# Patient Record
Sex: Male | Born: 1990 | Race: White | Hispanic: No | Marital: Single | Smoking: Current every day smoker
Health system: Southern US, Community
[De-identification: ages and names within clinical notes are randomized; demographics above are authoritative.]

## PROBLEM LIST (undated history)

## (undated) DIAGNOSIS — F988 Other specified behavioral and emotional disorders with onset usually occurring in childhood and adolescence: Secondary | ICD-10-CM

## (undated) HISTORY — PX: SHOULDER ARTHROSCOPY: SHX128

---
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
## 2013-08-28 ENCOUNTER — Ambulatory Visit (HOSPITAL_COMMUNITY)
Admission: RE | Admit: 2013-08-28 | Discharge: 2013-08-28 | Disposition: A | Discharge status: Home / Self Care | Payer: 59 | Source: Ambulatory Visit | Attending: Specialist | Admitting: Specialist

## 2013-08-28 ENCOUNTER — Other Ambulatory Visit (HOSPITAL_COMMUNITY): Payer: Self-pay | Admitting: Specialist

## 2013-08-28 DIAGNOSIS — Z1389 Encounter for screening for other disorder: Secondary | ICD-10-CM | POA: Insufficient documentation

## 2013-08-28 DIAGNOSIS — IMO0002 Reserved for concepts with insufficient information to code with codable children: Secondary | ICD-10-CM

## 2015-10-06 ENCOUNTER — Ambulatory Visit (INDEPENDENT_AMBULATORY_CARE_PROVIDER_SITE_OTHER): Payer: 59 | Admitting: Medical

## 2015-10-06 ENCOUNTER — Encounter: Payer: Self-pay | Admitting: Medical

## 2015-10-06 VITALS — BP 100/70 | HR 86 | Ht 72.25 in | Wt 173.0 lb

## 2015-10-06 DIAGNOSIS — F909 Attention-deficit hyperactivity disorder, unspecified type: Secondary | ICD-10-CM

## 2015-10-06 DIAGNOSIS — G4700 Insomnia, unspecified: Secondary | ICD-10-CM | POA: Diagnosis not present

## 2015-10-06 DIAGNOSIS — Z599 Problem related to housing and economic circumstances, unspecified: Secondary | ICD-10-CM

## 2015-10-06 DIAGNOSIS — Z598 Other problems related to housing and economic circumstances: Secondary | ICD-10-CM

## 2015-10-06 DIAGNOSIS — M722 Plantar fascial fibromatosis: Secondary | ICD-10-CM

## 2015-10-06 DIAGNOSIS — F988 Other specified behavioral and emotional disorders with onset usually occurring in childhood and adolescence: Secondary | ICD-10-CM

## 2015-10-06 MED ORDER — METHYLPHENIDATE HCL 10 MG PO TABS: 10.0000 mg | ORAL_TABLET | Freq: Two times a day (BID) | ORAL | Status: DC

## 2015-10-06 MED ORDER — CLONIDINE HCL 0.1 MG PO TABS: 0.1000 mg | ORAL_TABLET | Freq: Every day | ORAL | Status: DC

## 2015-10-06 NOTE — Progress Notes (Signed)
Subjective: Chief Complaint  Patient presents with  . New Patient (Initial Visit)    used to go to [facility], said last physical was 08/15. declines flu shot.  . 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   . trouble falling asleep    said his brain wont stop running.   . foot pain    said that its mainly in his lt foot. feels like a tendon he said. states he tried different boots and insoles nothing has helped.    Here as a new patient.  I see his mother Saralyn PilarLouann Keaton.  Has several concerns.   Having trouble staying focused at work.   Has hx/o ADD, was on medication when he was younger.  Got tired of the medication and stopped taking this.   Was diagnosed ADD in first grade, diagnosed by Dr. Rosezetta SchlatterBurnette at [facility].   Was on medication from first grade up to high school.  Decided to quit taking it in high school.   He only recalls being on Ritalin and a patch at one point.  Was tried on others, but would get nausea or not feel himself on the medication.   hasn't been on medication since high school.  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  Feels like he needs to resume ADD medication.   Has trouble getting to sleep, mind wont' shut down.   Has trouble getting to sleep initially. Will go to bed at 9 or 10pm and won't fall asleep til 3am.  Currently stressors include bills, work, in the winter work is limited.   He and fiance split up a month ago.  Job security is a concern. Thinks about safety for him and others as people often pull out in front of him driving big trucks.   He is single, lives with mother.   Has no significant other.   Works at [employer].   Drives steel body dump trailer, relatively short distances.   Attends church, praying about his concerns.  Not  exercising as much as prior due to shoulder issue.  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   In the winter, work is not always steady.   Has truck and 4 wheeler payment.  He and fiance just broke things off recently.   Has bilat feet pain in soles for 3 years, poor arches, has tried orthotics and different boots, but nothing helps .softer sole shoes make it worse.    ROS as in subjective   Objective: BP 100/70 mmHg  Pulse 86  Ht 6' 0.25" (1.835 m)  Wt 173 lb (78.472 kg)  BMI 23.30 kg/m2  Gen: wd, wn, nad Heart: RRR, normal s1, s2, no murmurs lungs clear Pulses normal Ext: no edema Psych: pleasant, good eye contact, answers questions appropriately Feet mild tenderness of left volar foot, poor arches bilat, otherwise feet nontender with out deformity, normal ROM Neuro: nonfocal exam throughout    Assessment: Encounter Diagnoses  Name Primary?  . ADD (attention deficit disorder) Yes  . Insomnia   . Financial difficulty   . Plantar fasciitis      Plan: ADD - reviewed history, current symptoms, concerns.   Adult ADHD-RS-IV with Adult Prompts questionnaire with Inattentive score of 15, and hyperactive-impulse score of 15.  Begin trial of  Methylphenidate  one to 2 times daily.  Recheck 52mo.  Insomnia - begin trial of QHS Clonidine after 1-2 week if still having issues.   Discussed other ways to work on sleep hygiene.  If starting Clonidine, then don't stop abruptly without consulting me  Financial difficulty - discussed his concern, budgeting, having long range and short range goals and plans  Plantar fascitis - begin daily towel stretch and tennis ball massage.  He has failed orthotic.  If not improving in a few weeks, then will try 90 degree QHS splints. Of note, he wears boots/cowboy boots all the time.

## 2015-11-30 ENCOUNTER — Telehealth: Payer: Self-pay | Admitting: Medical

## 2015-11-30 NOTE — Telephone Encounter (Signed)
Please call  Ritalin is not working and he can not take the sleep meds, when he took it he slept 22 hours straight Mom states he is not sleeping due to his anxiety . Mom states he has hard time trying to explain his symptons to you  She called for him but call back to patient

## 2015-12-01 NOTE — Telephone Encounter (Signed)
Made appt for Friday morning

## 2015-12-01 NOTE — Telephone Encounter (Signed)
He was suppose to f/u in 32mo.  So schedule f/u to discuss next steps.

## 2015-12-03 ENCOUNTER — Ambulatory Visit (INDEPENDENT_AMBULATORY_CARE_PROVIDER_SITE_OTHER): Payer: 59 | Admitting: Medical

## 2015-12-03 ENCOUNTER — Encounter: Payer: Self-pay | Admitting: Medical

## 2015-12-03 VITALS — BP 122/78 | HR 68 | Wt 170.0 lb

## 2015-12-03 DIAGNOSIS — G4700 Insomnia, unspecified: Secondary | ICD-10-CM | POA: Insufficient documentation

## 2015-12-03 DIAGNOSIS — F909 Attention-deficit hyperactivity disorder, unspecified type: Secondary | ICD-10-CM | POA: Diagnosis not present

## 2015-12-03 DIAGNOSIS — F988 Other specified behavioral and emotional disorders with onset usually occurring in childhood and adolescence: Secondary | ICD-10-CM | POA: Insufficient documentation

## 2015-12-03 MED ORDER — TRAZODONE HCL 50 MG PO TABS: ORAL_TABLET | ORAL | Status: AC

## 2015-12-03 MED ORDER — AMPHETAMINE-DEXTROAMPHET ER 15 MG PO CP24: 15.0000 mg | ORAL_CAPSULE | ORAL | Status: AC

## 2015-12-03 NOTE — Progress Notes (Signed)
Subjective: Chief Complaint  Patient presents with  . follow-up    follow-up on meds. Ritalin- doesn't seem to be helping and sleep med is too strong. only took it once and slept for 20 hours   Here for f/u from first visit regarding ADD and sleep issues.  Since last visit Ritalin didn't seem to work and felt like the one time he took Clonidine he sleep 22 hours.   Since last visit taking Ritalin but seeing absolutely no improvements, doesn't even feel like anything is in his system.   He only took the Clonidine once.   He has been eating healthier and exercising more since last visit.  From last visit, he has trouble staying focused at work.   Has hx/o ADD, was on medication when he was younger.  Got tired of the medication and stopped taking this.   Was diagnosed ADD in first grade, diagnosed by Dr. Rosezetta Schlatter at [facility].   Was on medication from first grade up to high school.  Decided to quit taking it in high school.   He only recalls being on Ritalin and a patch at one point.  Was tried on others, but would get nausea or not feel himself on the medication.   hasn't been on medication since high school.  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  Feels like he needs to resume ADD medication.   Has trouble getting to sleep, mind wont' shut down.   Has trouble getting to sleep initially. Will go to bed at 9 or 10pm and won't fall asleep til 3am.  Currently stressors include bills, work, in the winter work is limited.   He and fiance split up a month ago.  Job security is a concern. Thinks about safety for him and others as people often pull out in front of him driving big trucks.   He is single, lives with mother.   Has no significant other.   Works at [employer].   Drives steel body dump trailer, relatively short distances.   Attends church, praying about his concerns.    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   In the winter, work is not always steady.   Has truck and 4 wheeler payment.  He and fiance just broke things off recently.   ROS as in subjective   Objective: BP 122/78 mmHg  Pulse 68  Wt 170 lb (77.111 kg)  Gen: wd, wn, nad Psych: pleasant, good eye contact, answers questions appropriately   Assessment: Encounter Diagnoses  Name Primary?  . ADD (attention deficit disorder) Yes  . Insomnia      Plan: Discussed recommendations and changes below.  Discussed risks/benefits of medication, proper use of medications.   Call report 1wk  Patient Instructions  Recommendations:  Begin Adderall XR once daily in the morning  This is taken once daily, not twice daily  Also begin Benadryl OTC  at bedtime starting today  Once you have been on the Adderall at least a week, then call back to give [place] update on how this is doing  Once you feel like this is working ok, then we will potentially add Trazodone for sleep  Trazodone is a sleep aid that can be taken 1/2 or 1 tablet at night daily.   If you end up starting this, begin 1/2 tablet at bedtime daily  Don't start Trazodone though until  you have been on the Adderall at least a week  Use benadryl initially for sleep.    Keep bedtime consistent

## 2015-12-03 NOTE — Patient Instructions (Signed)
Recommendations:  Begin Adderall XR once daily in the morning  This is taken once daily, not twice daily  Also begin Benadryl OTC  at bedtime starting today  Once you have been on the Adderall at least a week, then call back to give [place] update on how this is doing  Once you feel like this is working ok, then we will potentially add Trazodone for sleep  Trazodone is a sleep aid that can be taken 1/2 or 1 tablet at night daily.   If you end up starting this, begin 1/2 tablet at bedtime daily  Don't start Trazodone though until you have been on the Adderall at least a week  Use benadryl initially for sleep.    Keep bedtime consistent

## 2017-04-19 ENCOUNTER — Emergency Department (HOSPITAL_COMMUNITY)
Admission: EM | Admit: 2017-04-19 | Discharge: 2017-04-19 | Disposition: A | Discharge status: Home / Self Care | Payer: Self-pay | Attending: Emergency Medicine | Admitting: Emergency Medicine

## 2017-04-19 ENCOUNTER — Other Ambulatory Visit: Payer: Self-pay

## 2017-04-19 ENCOUNTER — Emergency Department (HOSPITAL_COMMUNITY): Payer: Self-pay

## 2017-04-19 ENCOUNTER — Encounter (HOSPITAL_COMMUNITY): Payer: Self-pay | Admitting: Emergency Medicine

## 2017-04-19 DIAGNOSIS — E860 Dehydration: Secondary | ICD-10-CM | POA: Insufficient documentation

## 2017-04-19 DIAGNOSIS — R55 Syncope and collapse: Secondary | ICD-10-CM

## 2017-04-19 DIAGNOSIS — R112 Nausea with vomiting, unspecified: Secondary | ICD-10-CM | POA: Insufficient documentation

## 2017-04-19 DIAGNOSIS — F17200 Nicotine dependence, unspecified, uncomplicated: Secondary | ICD-10-CM | POA: Insufficient documentation

## 2017-04-19 HISTORY — DX: Other specified behavioral and emotional disorders with onset usually occurring in childhood and adolescence: F98.8

## 2017-04-19 LAB — BASIC METABOLIC PANEL
Anion gap: 7 (ref 5–15)
BUN: 15 mg/dL (ref 6–20)
CALCIUM: 8.9 mg/dL (ref 8.9–10.3)
CO2: 25 mmol/L (ref 22–32)
CREATININE: 1.05 mg/dL (ref 0.61–1.24)
Chloride: 106 mmol/L (ref 101–111)
GFR calc Af Amer: >60 mL/min (ref >60)
GFR calc non Af Amer: >60 mL/min (ref >60)
GLUCOSE: 96 mg/dL (ref 65–99)
Potassium: 4 mmol/L (ref 3.5–5.1)
Sodium: 138 mmol/L (ref 135–145)

## 2017-04-19 LAB — CBC
HCT: 41.9 % (ref 39.0–52.0)
HEMOGLOBIN: 14.2 g/dL (ref 13.0–17.0)
MCH: 30.7 pg (ref 26.0–34.0)
MCHC: 33.9 g/dL (ref 30.0–36.0)
MCV: 90.7 fL (ref 78.0–100.0)
PLATELETS: 196 10*3/uL (ref 150–400)
RBC: 4.62 MIL/uL (ref 4.22–5.81)
RDW: 12.7 % (ref 11.5–15.5)
WBC: 6.1 10*3/uL (ref 4.0–10.5)

## 2017-04-19 LAB — URINALYSIS, ROUTINE W REFLEX MICROSCOPIC
BILIRUBIN URINE: NEGATIVE
Glucose, UA: NEGATIVE mg/dL
HGB URINE DIPSTICK: NEGATIVE
KETONES UR: NEGATIVE mg/dL
Leukocytes, UA: NEGATIVE
Nitrite: NEGATIVE
Protein, ur: NEGATIVE mg/dL
SPECIFIC GRAVITY, URINE: 1.025 (ref 1.005–1.030)
pH: 6 (ref 5.0–8.0)

## 2017-04-19 LAB — CK: Total CK: 93 U/L (ref 49–397)

## 2017-04-19 MED ORDER — ACETAMINOPHEN 500 MG PO TABS
1000.0000 mg | ORAL_TABLET | Freq: Once | ORAL | Status: AC
  Administered 2017-04-19: 1000 mg via ORAL
  Filled 2017-04-19: qty 2

## 2017-04-19 MED ORDER — SODIUM CHLORIDE 0.9 % IV BOLUS (SEPSIS)
1000.0000 mL | Freq: Once | INTRAVENOUS | Status: AC
  Administered 2017-04-19: 1000 mL via INTRAVENOUS

## 2017-04-19 NOTE — ED Notes (Signed)
Hooked patient up to the monitor patient is resting with family at bedside 

## 2017-04-19 NOTE — ED Notes (Signed)
Pt in xray

## 2017-04-19 NOTE — ED Notes (Signed)
Water given to pt 

## 2017-04-19 NOTE — ED Notes (Signed)
Pt reports that he was bitten by a tick a month ago in left groin.  Pt was not seen by any healthcare provider after this.  Pt reports that there was some redness around this and that he got "kinda achy".  Pt states that he was dizzy this am and he continues to feel generally weak and continues to have some nausea and when he stands up he gets lightheaded and dizzy

## 2017-04-19 NOTE — ED Provider Notes (Signed)
[facility]-EMERGENCY DEPT Provider Note   CSN: [id number] Arrival date & time: 04/19/17  4098     History   Chief Complaint Chief Complaint  Patient presents with  . Loss of Consciousness  . Emesis    HPI Antonio Delgado is a 26 y.o. male who presents for evaluation of syncope.  He reports that around midnight he was awakened from sleep by the need to vomit.  He reports that he was vomiting non bloody non bilious vomit for about one hour.  No diarrhea or constipation. He then reports that he felt very light headed and passed out.  He is unsure what happened between 1am and 8am when he woke up next to the toilet.  He is unsure if he struck his head.  He reports that his head currently feels "funny" but is unable to describe it.  He denies neck pain, SOB or chest pain.   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  He denies coming into contact with any hazardous materials saying that he was only driving and that everything was already secured and placarded.  Yesterday he felt hot, acknowledges that he doesn't hydrate well while "on a job" but hydrates well between jobs.  He denies suspicious food, last night he ate a few hot dogs from [employer] but does that on a regular basis.  Denies drug or alcohol use.  He was bitten by a tick about one month ago in his upper inner left thigh/inguinal region.  No rashes, has been well since.   HPI  Past Medical History:  Diagnosis Date  . ADD (attention deficit disorder)     Patient Active Problem List   Diagnosis Date Noted  . ADD (attention deficit disorder) 12/03/2015  . Insomnia 12/03/2015    History reviewed. No pertinent surgical history.     Home Medications    Prior to Admission medications   Medication Sig Start Date End Date Taking? Authorizing Provider  amphetamine-dextroamphetamine (ADDERALL XR) 15 MG 24 hr capsule Take 1 capsule by mouth every morning. Patient not taking: Reported on 04/19/2017 12/03/15   Jac Canavan,  PA-C  traZODone (DESYREL) 50 MG tablet 1/2-1 tablet po QHS sleep Patient not taking: Reported on 04/19/2017 12/03/15   Tysinger, Kermit Balo, PA-C    Family History History reviewed. No pertinent family history.  Social History Social History  Substance Use Topics  . Smoking status: Current Every Day Smoker    Packs/day: 0.50  . Smokeless tobacco: Never Used  . Alcohol use 0.0 oz/week     Allergies   Daytrana [methylphenidate]   Review of Systems Review of Systems  Constitutional: Positive for appetite change and fever (Subjective only, feels hot).  HENT: Negative for congestion, ear pain, rhinorrhea and sore throat.   Eyes: Negative for pain and visual disturbance.  Respiratory: Negative for cough, chest tightness and shortness of breath.   Cardiovascular: Negative for chest pain and palpitations.  Gastrointestinal: Positive for abdominal pain (After vomiting), nausea and vomiting. Negative for abdominal distention, constipation and diarrhea.  Genitourinary: Negative for difficulty urinating, dysuria, flank pain, frequency, genital sores and urgency.  Musculoskeletal: Positive for myalgias (Normal for patient, slightly increased). Negative for arthralgias, back pain, neck pain and neck stiffness.  Skin: Positive for wound (Left upper thigh, from tick bite). Negative for color change.  Neurological: Positive for weakness, light-headedness and headaches ("feels funny"). Negative for dizziness and numbness.     Physical Exam Updated Vital Signs BP Marland Kitchen)  107/55   Pulse 68   Temp 97.7 F (36.5 C) (Oral)   Resp 13   Ht 6' (1.829 m)   Wt 80.7 kg (178 lb)   SpO2 100%   BMI 24.14 kg/m   Physical Exam  Constitutional: He appears well-developed and well-nourished. He does not appear ill.  HENT:  Head: Normocephalic and atraumatic. Head is without raccoon's eyes and without Battle's sign.  Right Ear: Tympanic membrane, external ear and ear canal normal. No hemotympanum.  Left Ear:  Tympanic membrane, external ear and ear canal normal. No hemotympanum.  Nose: Nose normal. No mucosal edema, nasal deformity or nasal septal hematoma.  Mouth/Throat: Uvula is midline, oropharynx is clear and moist and mucous membranes are normal. No oropharyngeal exudate.  Eyes: Conjunctivae and EOM are normal. Pupils are equal, round, and reactive to light. No scleral icterus.  Neck: Trachea normal, normal range of motion and full passive range of motion without pain. Neck supple. No spinous process tenderness and no muscular tenderness present. No neck rigidity. No erythema and normal range of motion present.  Cardiovascular: Normal rate, regular rhythm, normal heart sounds, intact distal pulses and normal pulses.   No murmur heard. Pulmonary/Chest: Effort normal and breath sounds normal. No accessory muscle usage. No respiratory distress. He has no decreased breath sounds. He has no wheezes.  Abdominal: Soft. Normal appearance and bowel sounds are normal. He exhibits no distension and no mass. There is no hepatosplenomegaly. There is tenderness in the epigastric area and left upper quadrant. There is no rebound, no guarding, no tenderness at McBurney's point and negative Murphy's sign.  Musculoskeletal:  Neck, mid and lower back all palpated.  No midline tenderness, deformities or setoffs.  No paraspinal muscle spasm or pain. All extremities are soft, nontender.   Lymphadenopathy:    He has no cervical adenopathy. No inguinal adenopathy noted on the left side.       Left: Inguinal adenopathy present.  Neurological: He is alert.  Mental Status:  Alert, oriented, thought content appropriate, able to give a coherent history. Speech fluent without evidence of aphasia. Able to follow 2 step commands without difficulty.  Cranial Nerves:  II:  Peripheral visual fields grossly normal, pupils equal, round, reactive to light III,IV, VI: ptosis not present, extra-ocular motions intact bilaterally  V,VII:  smile symmetric, facial light touch sensation equal VIII: hearing grossly normal to voice  X: uvula elevates symmetrically  XI: bilateral shoulder shrug symmetric and strong XII: midline tongue extension without fassiculations Motor:  Normal tone. 5/5 in upper and lower extremities bilaterally including strong and equal grip strength and dorsiflexion/plantar flexion CV: distal pulses palpable throughout    Skin: Skin is warm and dry. No rash noted. He is not diaphoretic.  1cm palpable nodule on left upper medial thigh with mild erythema.  Papule is non tender, non umbilicated with no drainage or obvious signs of infection.   Psychiatric: He has a normal mood and affect.  Nursing note and vitals reviewed.    ED Treatments / Results  Labs (all labs ordered are listed, but only abnormal results are displayed) Labs Reviewed  BASIC METABOLIC PANEL  CBC  URINALYSIS, ROUTINE W REFLEX MICROSCOPIC  CK    EKG  EKG Interpretation  Date/Time:  Thursday April 19 2017 09:28:03 EDT Ventricular Rate:  71 PR Interval:  158 QRS Duration: 94 QT Interval:  362 QTC Calculation: 393 R Axis:   93 Text Interpretation:  Normal sinus rhythm Rightward axis No previous tracing Confirmed  by Cathren Laine ([id number]) on 04/19/2017 1:39:33 PM       Radiology Ct Head Wo Contrast  Result Date: 04/19/2017 CLINICAL DATA:  Onset of nausea and vomiting last night with syncopal episode, again this morning, smoker EXAM: CT HEAD WITHOUT CONTRAST TECHNIQUE: Contiguous axial images were obtained from the base of the skull through the vertex without intravenous contrast. Sagittal and coronal MPR images reconstructed from axial data set. COMPARISON:  None FINDINGS: Brain: Normal ventricular morphology. No midline shift or mass effect. Normal appearance of brain parenchyma. No intracranial hemorrhage, mass lesion, evidence of acute infarction, or extra-axial fluid collection. Vascular: Normal appearance Skull: Normal  appearance Sinuses/Orbits: Clear Other: N/A IMPRESSION: Normal exam. Electronically Signed   By: Ulyses Southward M.D.   On: 04/19/2017 12:00    Procedures Procedures (including critical care time)  Medications Ordered in ED Medications  sodium chloride 0.9 % bolus 1,000 mL (0 mLs Intravenous Stopped 04/19/17 1224)  acetaminophen (TYLENOL) tablet 1,000 mg (1,000 mg Oral Given 04/19/17 1224)  sodium chloride 0.9 % bolus 1,000 mL (0 mLs Intravenous Stopped 04/19/17 1301)     Initial Impression / Assessment and Plan / ED Course  I have reviewed the triage vital signs and the nursing notes.  Pertinent labs & imaging results that were available during my care of the patient were reviewed by me and considered in my medical decision making (see chart for details).  Clinical Course as of Apr 20 2029  Thu Apr 19, 2017  1235 Spoke with lab, stated CK did not get received by them, but she will add it on and start it.   [EH]  1322 Patient re-checked, states head feels better.  Given concussion education and syncope precautions.  Patient will get PO challenge.    [EH]    Clinical Course User Index [EH] Cristina Gong, PA-C   Patient without arrhythmia or tachycardia while here in the department.  Suspect syncope was secondary to combination of dehydration and vagal activation from vomiting.  Patient without history of congestive heart failure, normal hematocrit, normal ECG, no shortness of breath and systolic blood pressure greater than 90; patient is low risk. Will plan for discharge home with close PCP follow-up.  Possibility of recurrent syncope has been discussed. I discussed reasons to avoid driving until PCP followup and other safety prevention including use of ladders and working at heights. Patient may have slight concussion, head felt better after tylenol and two liters of fluids, given concussion precautions.   Pt has remained hemodynamically stable throughout their time in the ED  BP (!)  107/55   Pulse 68   Temp 97.7 F (36.5 C) (Oral)   Resp 13   Ht 6' (1.829 m)   Wt 80.7 kg (178 lb)   SpO2 100%   BMI 24.14 kg/m    The patient was discussed with and seen by Dr. Denton Lank who agrees with the treatment plan.     Final Clinical Impressions(s) / ED Diagnoses   Final diagnoses:  Dehydration  Syncope and collapse  Non-intractable vomiting with nausea, unspecified vomiting type    New Prescriptions Discharge Medication List as of 04/19/2017  2:57 PM       Cristina Gong, PA-C 04/20/17 2032    Cathren Laine, MD 04/23/17 [phone]

## 2017-04-19 NOTE — ED Notes (Signed)
Pt given water to drink. 

## 2017-04-19 NOTE — ED Notes (Signed)
Pt. Finished his crackers and gingerale and states he feels good and does not have any nausea.

## 2017-04-19 NOTE — ED Triage Notes (Signed)
Pt states he started vomiting around midnight last night. Pt proceeded to pass out in bathroom, woke up and continued to vomit. When he tried to stand up, he passed out again. Pt feels lightheaded when standing up.

## 2017-04-19 NOTE — Discharge Instructions (Signed)
Please follow all of the discussed head injury precautions.  Please seek additional medical care if your symptoms return or worsen.

## 2019-04-22 ENCOUNTER — Emergency Department (HOSPITAL_COMMUNITY): Payer: 59

## 2019-04-22 ENCOUNTER — Other Ambulatory Visit: Payer: Self-pay

## 2019-04-22 ENCOUNTER — Emergency Department (HOSPITAL_COMMUNITY)
Admission: EM | Admit: 2019-04-22 | Discharge: 2019-04-22 | Disposition: A | Discharge status: Home / Self Care | Payer: 59 | Attending: Emergency Medicine | Admitting: Emergency Medicine

## 2019-04-22 DIAGNOSIS — M542 Cervicalgia: Secondary | ICD-10-CM | POA: Diagnosis not present

## 2019-04-22 DIAGNOSIS — W109XXA Fall (on) (from) unspecified stairs and steps, initial encounter: Secondary | ICD-10-CM | POA: Diagnosis not present

## 2019-04-22 DIAGNOSIS — M546 Pain in thoracic spine: Secondary | ICD-10-CM | POA: Insufficient documentation

## 2019-04-22 DIAGNOSIS — M25512 Pain in left shoulder: Secondary | ICD-10-CM | POA: Insufficient documentation

## 2019-04-22 DIAGNOSIS — W19XXXA Unspecified fall, initial encounter: Secondary | ICD-10-CM

## 2019-04-22 DIAGNOSIS — F17200 Nicotine dependence, unspecified, uncomplicated: Secondary | ICD-10-CM | POA: Diagnosis not present

## 2019-04-22 DIAGNOSIS — R202 Paresthesia of skin: Secondary | ICD-10-CM | POA: Diagnosis not present

## 2019-04-22 MED ORDER — METHOCARBAMOL 500 MG PO TABS: 500.0000 mg | ORAL_TABLET | Freq: Two times a day (BID) | ORAL | 0 refills | Status: AC

## 2019-04-22 MED ORDER — KETOROLAC TROMETHAMINE 30 MG/ML IJ SOLN
30.0000 mg | Freq: Once | INTRAMUSCULAR | Status: AC
  Administered 2019-04-22: 22:00:00 30 mg via INTRAMUSCULAR
  Filled 2019-04-22: qty 1
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
## 2019-04-22 NOTE — Discharge Instructions (Addendum)
1. Medications: alternate ibuprofen and tylenol for pain control, robaxin (muscle relaxant - do not drive or operate heavy machinery while taking these medications as it may cause drowsiness), usual home medications 2. Treatment: rest, ice, drink plenty of fluids, gentle stretching 3. Follow Up: Please follow up with orthopedics as directed or your PCP in 1 week if no improvement for discussion of your diagnoses and further evaluation after today's visit; if you do not have a primary care doctor use the resource guide provided to find one; Please return to the ER for worsening symptoms or other concerns

## 2019-04-22 NOTE — ED Notes (Signed)
Discharge instructions, pain management, and follow up care discussed with pt. Pt verbalized understanding. Pt to go home with mother.
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
## 2019-04-22 NOTE — ED Notes (Addendum)
Patient transported to CT, then will be transported to XR

## 2019-04-22 NOTE — ED Notes (Signed)
ED PA at bedside

## 2020-07-01 ENCOUNTER — Encounter: Payer: Self-pay | Admitting: Medical

## 2020-07-01 ENCOUNTER — Ambulatory Visit: Payer: 59 | Admitting: Medical

## 2020-07-01 ENCOUNTER — Other Ambulatory Visit: Payer: Self-pay

## 2020-07-01 VITALS — BP 130/70 | HR 79 | Ht 72.0 in | Wt 182.0 lb

## 2020-07-01 DIAGNOSIS — T148XXD Other injury of unspecified body region, subsequent encounter: Secondary | ICD-10-CM

## 2020-07-01 DIAGNOSIS — Z113 Encounter for screening for infections with a predominantly sexual mode of transmission: Secondary | ICD-10-CM | POA: Diagnosis not present

## 2020-07-01 DIAGNOSIS — R21 Rash and other nonspecific skin eruption: Secondary | ICD-10-CM

## 2020-07-01 DIAGNOSIS — L989 Disorder of the skin and subcutaneous tissue, unspecified: Secondary | ICD-10-CM

## 2020-07-01 DIAGNOSIS — Z131 Encounter for screening for diabetes mellitus: Secondary | ICD-10-CM | POA: Diagnosis not present

## 2020-07-01 NOTE — Progress Notes (Signed)
Subjective: Chief Complaint  Patient presents with  . New Patient (Initial Visit)    spot on foot     Here to reestablish care.  Last visit 2017  He notes skin concerns.  He started out with skin peeling and red patches on his bottom of his feet back in August 2020.  The skin patches will get red and inflamed and then will flake off but they wont heal completely.  Similarly he has little blisters under the skin and skin lesions on his palms that continue to come and go from the last 3 months.  They will not heal completely either  Otherwise feels healthy.  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  He denies other exposures to the hands.  He does get sweaty and thirsty being outside in the hot sun.  Given the lack of healing, his mother wants him to be checked for diabetes and have wound culture  He was on amoxicillin recently from urgent care for bacterial infection of the hand which helped a little bit the symptoms persist.  He had Covid infection 08/2019   Past Medical History:  Diagnosis Date  . ADD (attention deficit disorder)    Ros as in subjective    Objective: BP 130/70   Pulse 79   Ht 6' (1.829 m)   Wt 182 lb (82.6 kg)   SpO2 97%   BMI 24.68 kg/m   Gen: wd, wn, nad, white male Oral cavity: MMM, no lesions Neck: supple, no lymphadenopathy, no thyromegaly, no masses Heart: RRR, normal S1, S2, no murmurs Lungs: CTA bilaterally, no wheezes, rhonchi, or rales Abdomen: +bs, soft, non tender, non distended, no masses, no hepatomegaly, no splenomegaly Pulses: 2+ symmetric, upper and lower extremities, normal cap refill Left hand towards the base of the hand/volar wrist area with approximately 4 somewhat pinkish roundish lesions that appear to be resolving with a little bit of crust, similar but fewer lesions on the right volar hand just distal to the wrist, there are faint somewhat vesicular appearing findings up under the skin on the palms, otherwise  hands appear normal Right volar foot distally along the second through fifth MTP region is a bean shaped area approximately 6 cm x 3 cm patch of skin where the epidermis has been sloughed off leaving pinkish coloration irritated skin that he says has been like this for months.  There is a small round 1 cm patch of yellow-brown coloration on the left foot volar service of second MTP distally     Assessment: Encounter Diagnoses  Name Primary?  . Rash of both hands Yes  . Rash of both feet   . Screening for diabetes mellitus   . Wound healing, delayed   . Screen for STD (sexually transmitted disease)   . Skin lesion      Plan: We discussed hand rash and foot rash, etiology unclear.  Could be contact dermatitis, could be bacterial, could be other etiology  Labs today as below including screen for diabetes given the delayed wound healing and family history  We discussed good hygiene with soap and water  If labs come back normal consider topical Lotrisone for hands and feet as a trial of therapy  Wound culture of the right foot skin lesion sent  Zyon was seen today for new patient (initial visit).  Diagnoses and all orders for this visit:  Rash of both hands -     Comprehensive metabolic panel -     WOUND  CULTURE  Rash of both feet -     WOUND CULTURE  Screening for diabetes mellitus -     Hemoglobin A1c  Wound healing, delayed -     Comprehensive metabolic panel -     CBC with Differential/Platelet -     WOUND CULTURE -     Hemoglobin A1c -     HIV Antibody (routine testing w rflx) -     RPR  Screen for STD (sexually transmitted disease) -     HIV Antibody (routine testing w rflx) -     RPR  Skin lesion -     HIV Antibody (routine testing w rflx) -     RPR   F/u prn

## 2020-07-02 ENCOUNTER — Other Ambulatory Visit: Payer: Self-pay | Admitting: Medical

## 2020-07-02 LAB — RPR: RPR Ser Ql: NONREACTIVE

## 2020-07-02 LAB — COMPREHENSIVE METABOLIC PANEL
ALT: 17 IU/L (ref 0–44)
AST: 21 IU/L (ref 0–40)
Albumin/Globulin Ratio: 1.9 (ref 1.2–2.2)
Albumin: 4.6 g/dL (ref 4.1–5.2)
Alkaline Phosphatase: 66 IU/L (ref 48–121)
BUN/Creatinine Ratio: 13 (ref 9–20)
BUN: 14 mg/dL (ref 6–20)
Bilirubin Total: 0.3 mg/dL (ref 0.0–1.2)
CO2: 23 mmol/L (ref 20–29)
Calcium: 9.5 mg/dL (ref 8.7–10.2)
Chloride: 104 mmol/L (ref 96–106)
Creatinine, Ser: 1.05 mg/dL (ref 0.76–1.27)
GFR calc Af Amer: 113 mL/min/{1.73_m2} (ref >59)
GFR calc non Af Amer: 97 mL/min/{1.73_m2} (ref >59)
Globulin, Total: 2.4 g/dL (ref 1.5–4.5)
Glucose: 90 mg/dL (ref 65–99)
Potassium: 4.7 mmol/L (ref 3.5–5.2)
Sodium: 140 mmol/L (ref 134–144)
Total Protein: 7 g/dL (ref 6.0–8.5)

## 2020-07-02 LAB — CBC WITH DIFFERENTIAL/PLATELET
Basophils Absolute: 0.1 10*3/uL (ref 0.0–0.2)
Basos: 1 %
EOS (ABSOLUTE): 0.3 10*3/uL (ref 0.0–0.4)
Eos: 5 %
Hematocrit: 46 % (ref 37.5–51.0)
Hemoglobin: 15.9 g/dL (ref 13.0–17.7)
Immature Grans (Abs): 0.1 10*3/uL (ref 0.0–0.1)
Immature Granulocytes: 1 %
Lymphocytes Absolute: 1.6 10*3/uL (ref 0.7–3.1)
Lymphs: 26 %
MCH: 31.5 pg (ref 26.6–33.0)
MCHC: 34.6 g/dL (ref 31.5–35.7)
MCV: 91 fL (ref 79–97)
Monocytes Absolute: 0.5 10*3/uL (ref 0.1–0.9)
Monocytes: 8 %
Neutrophils Absolute: 3.7 10*3/uL (ref 1.4–7.0)
Neutrophils: 59 %
Platelets: 295 10*3/uL (ref 150–450)
RBC: 5.05 x10E6/uL (ref 4.14–5.80)
RDW: 12.4 % (ref 11.6–15.4)
WBC: 6.4 10*3/uL (ref 3.4–10.8)

## 2020-07-02 LAB — HEMOGLOBIN A1C
Est. average glucose Bld gHb Est-mCnc: 100 mg/dL
Hgb A1c MFr Bld: 5.1 % (ref 4.8–5.6)

## 2020-07-02 LAB — HIV ANTIBODY (ROUTINE TESTING W REFLEX): HIV Screen 4th Generation wRfx: NONREACTIVE

## 2020-07-02 MED ORDER — CLOTRIMAZOLE-BETAMETHASONE 1-0.05 % EX CREA: TOPICAL_CREAM | CUTANEOUS | 0 refills | Status: AC

## 2020-07-02 MED ORDER — FLUCONAZOLE 100 MG PO TABS: 100.0000 mg | ORAL_TABLET | Freq: Every day | ORAL | 0 refills | Status: AC

## 2020-07-05 LAB — WOUND CULTURE: Organism ID, Bacteria: NONE SEEN

## 2020-07-15 IMAGING — DX LUMBAR SPINE - COMPLETE 4+ VIEW
5 series · 5 of 5 positions shown · non-contrast
Comparison: None.

CLINICAL DATA: Trauma, pain

EXAM:
THORACIC SPINE 2 VIEWS; LUMBAR SPINE - COMPLETE 4+ VIEW

[t lumbar spine ap]
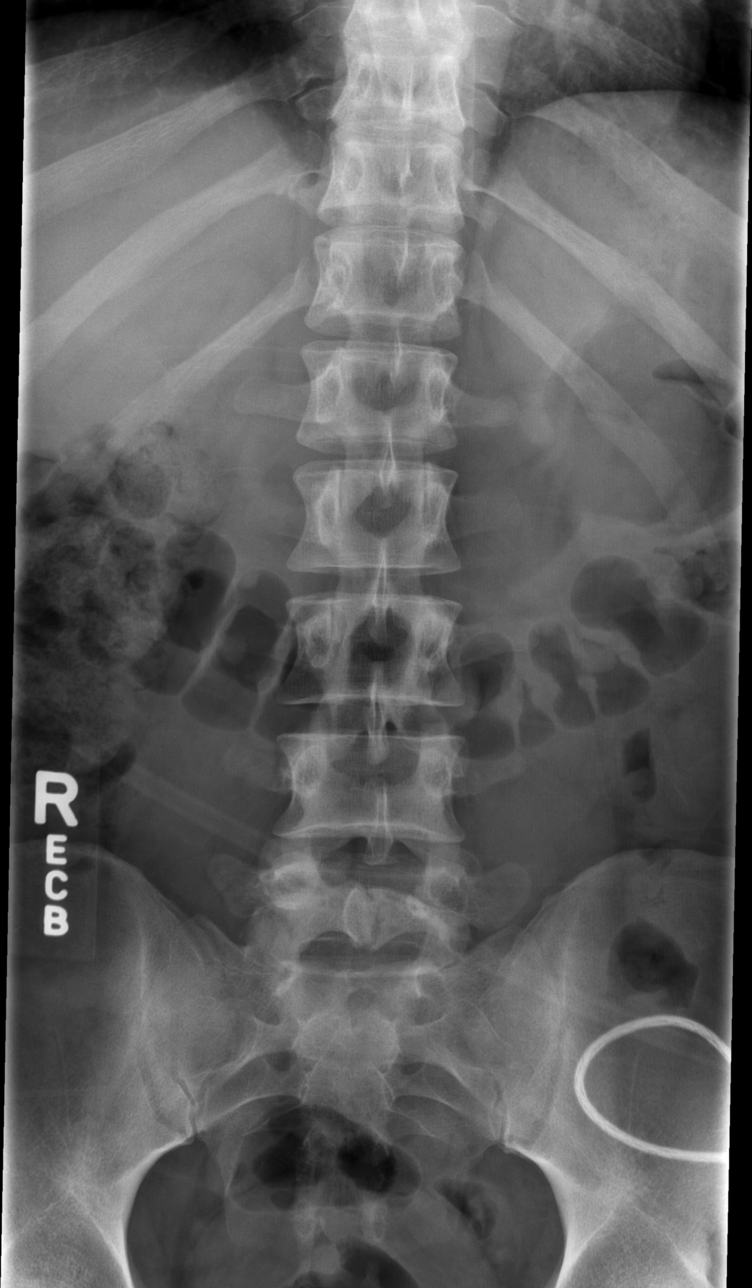

[t lumbar spine obl (1 of 2)]
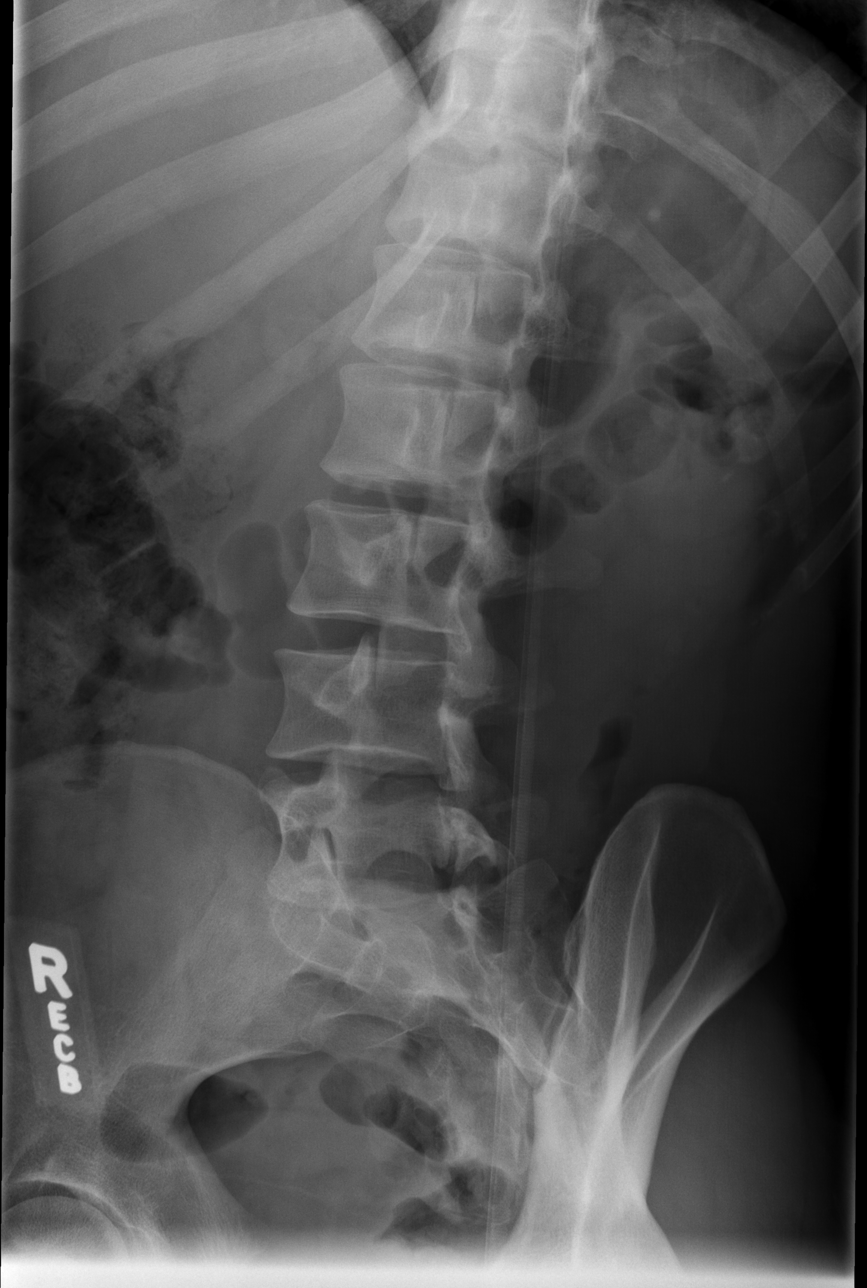

[t lumbar spine obl (2 of 2)]
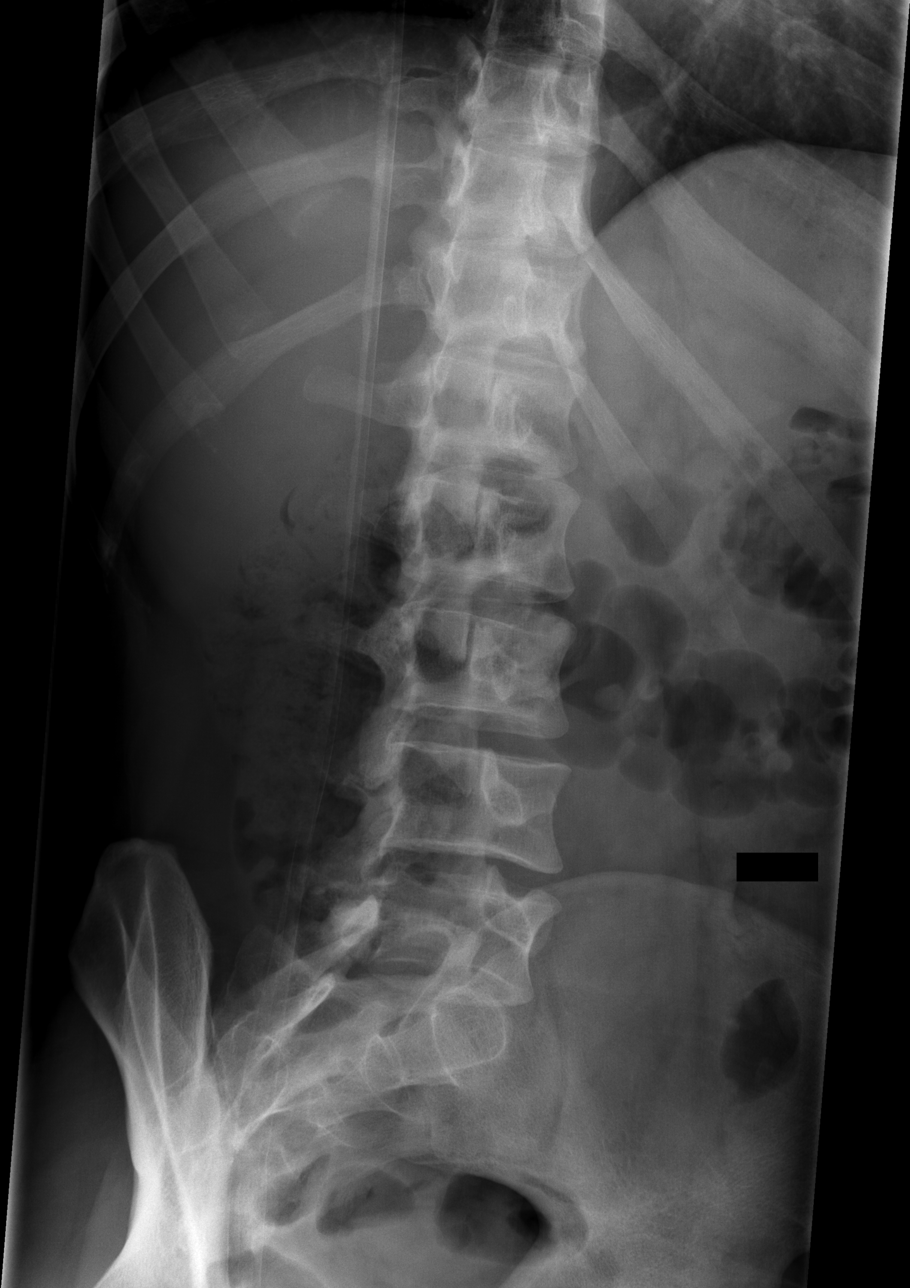

[t lumbar spine lat]
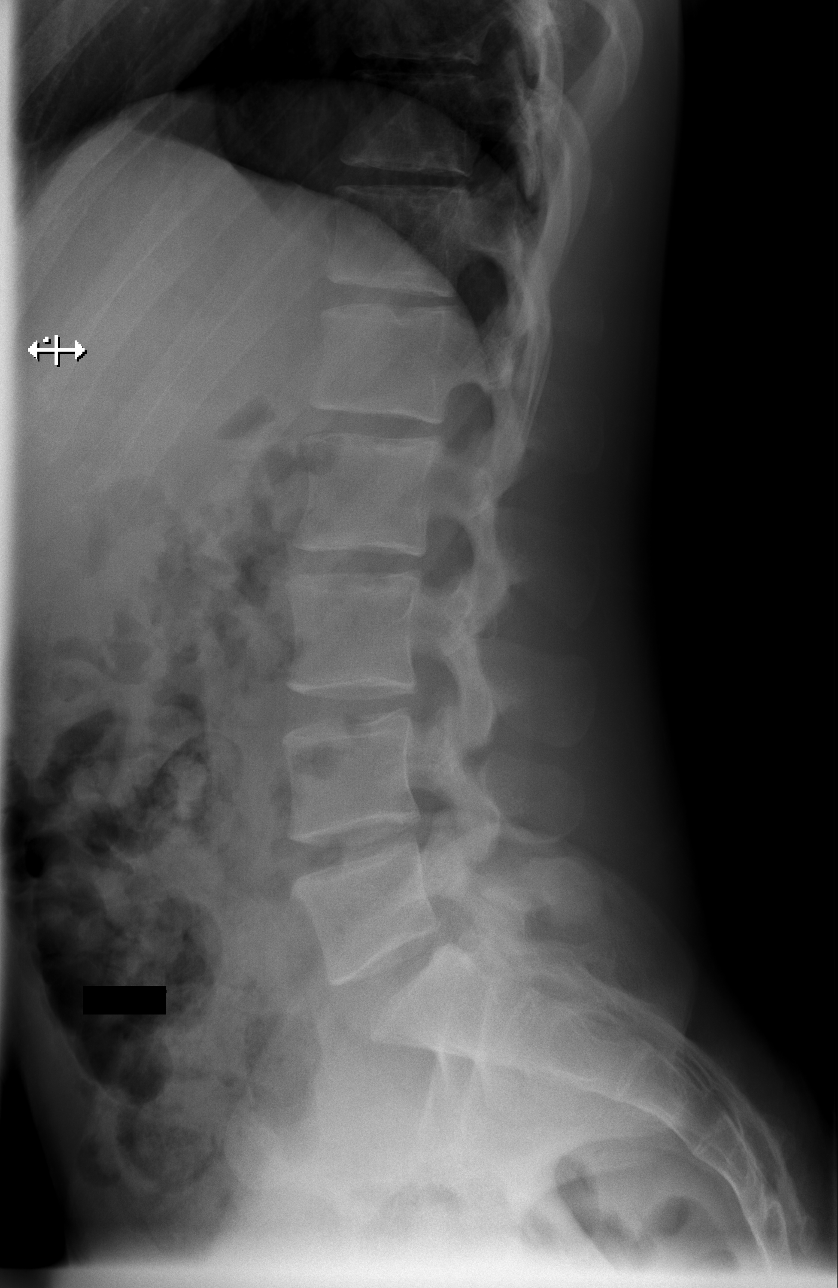

[t lumbar l-5 s-1 spot]
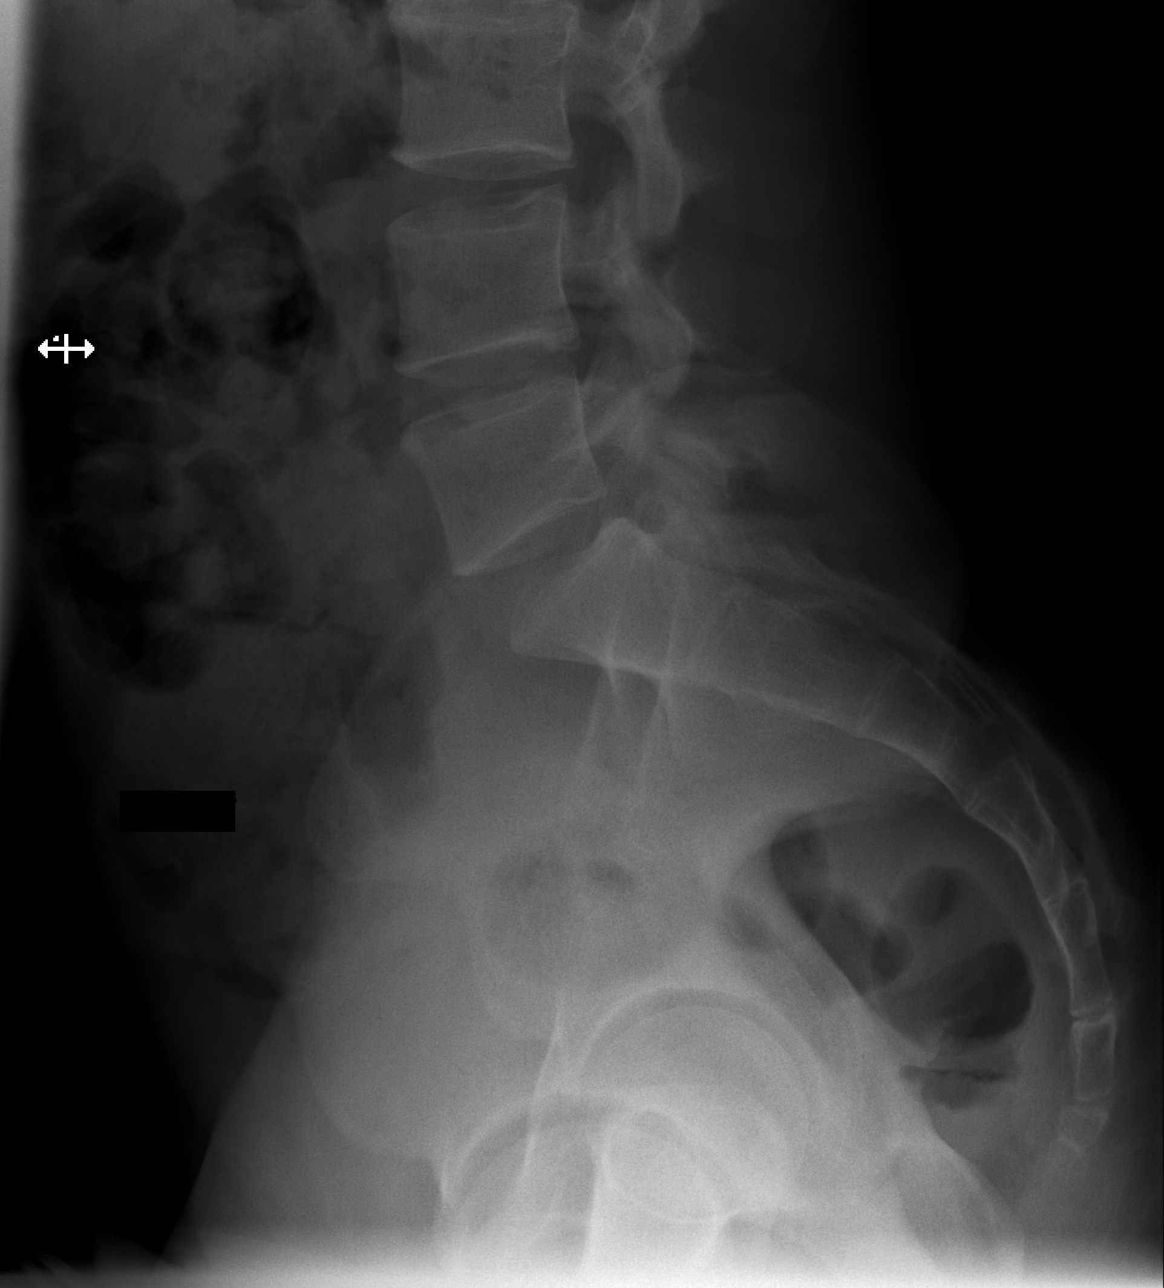

[5 of 5 positions shown; findings below may reference images not displayed]

FINDINGS: No fracture or dislocation of the thoracic or lumbar spine. Disc
spaces and vertebral body heights are preserved.
IMPRESSION: No fracture or dislocation of the thoracic or lumbar spine. Disc
spaces and vertebral body heights are preserved.

## 2020-07-15 IMAGING — DX LEFT SHOULDER - 2+ VIEW
2 series · 2 of 2 positions shown · non-contrast
Comparison: None.

CLINICAL DATA: Trauma, pain

EXAM:
LEFT SHOULDER - 2+ VIEW

[t shoulder y-view left]
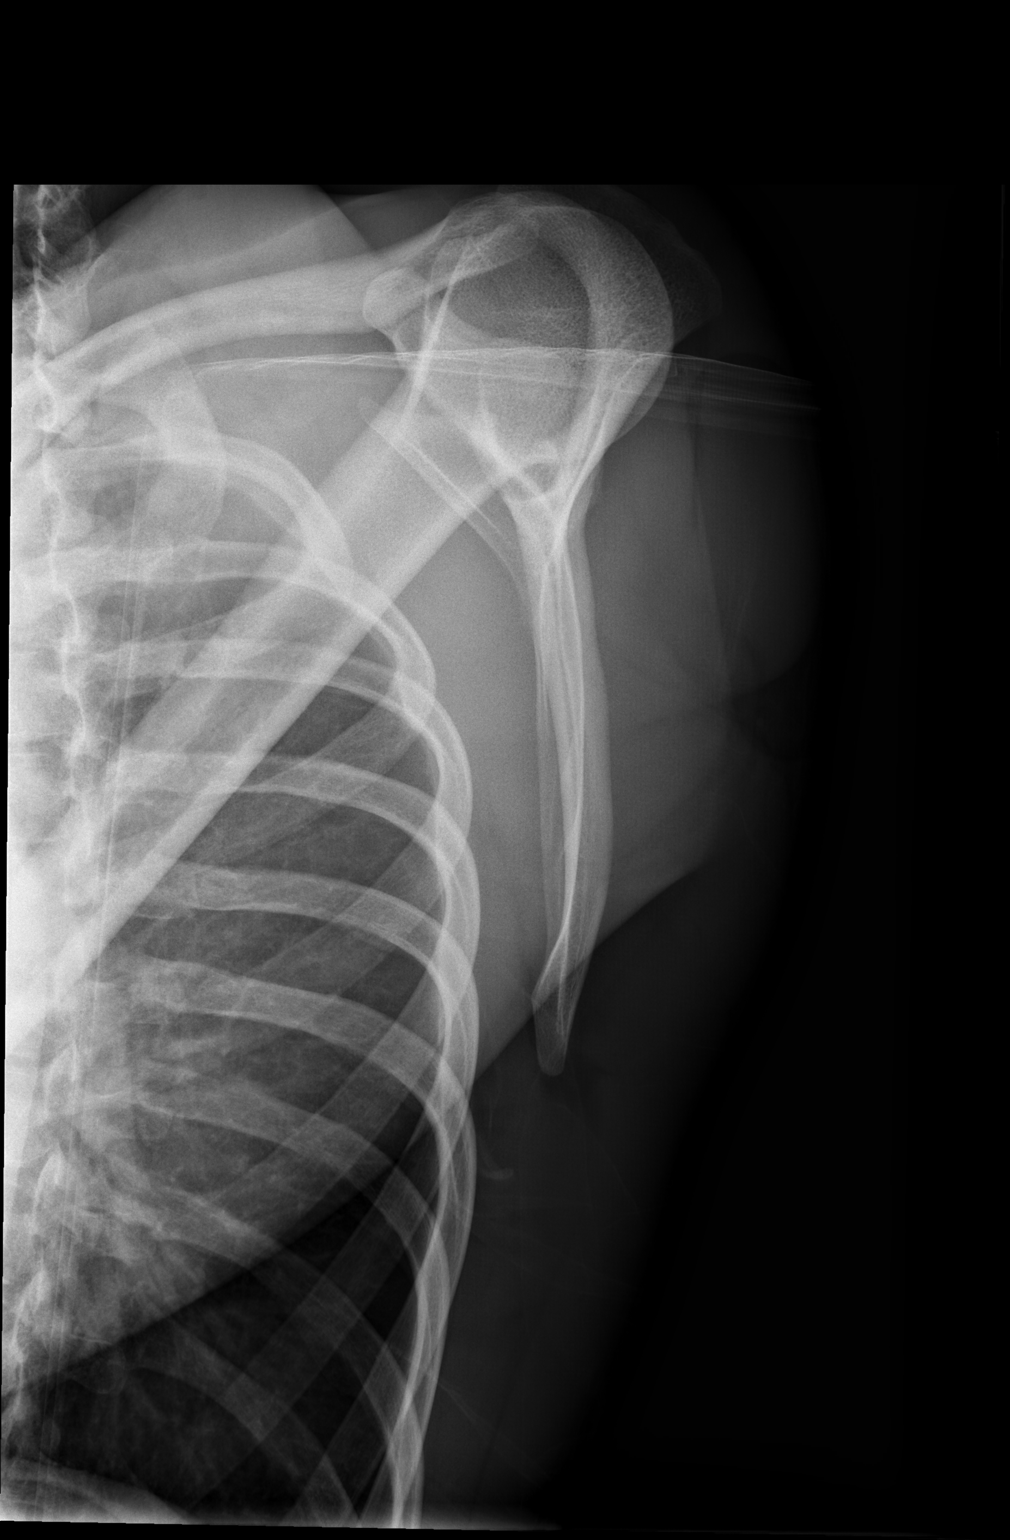

[t shoulder internal left]
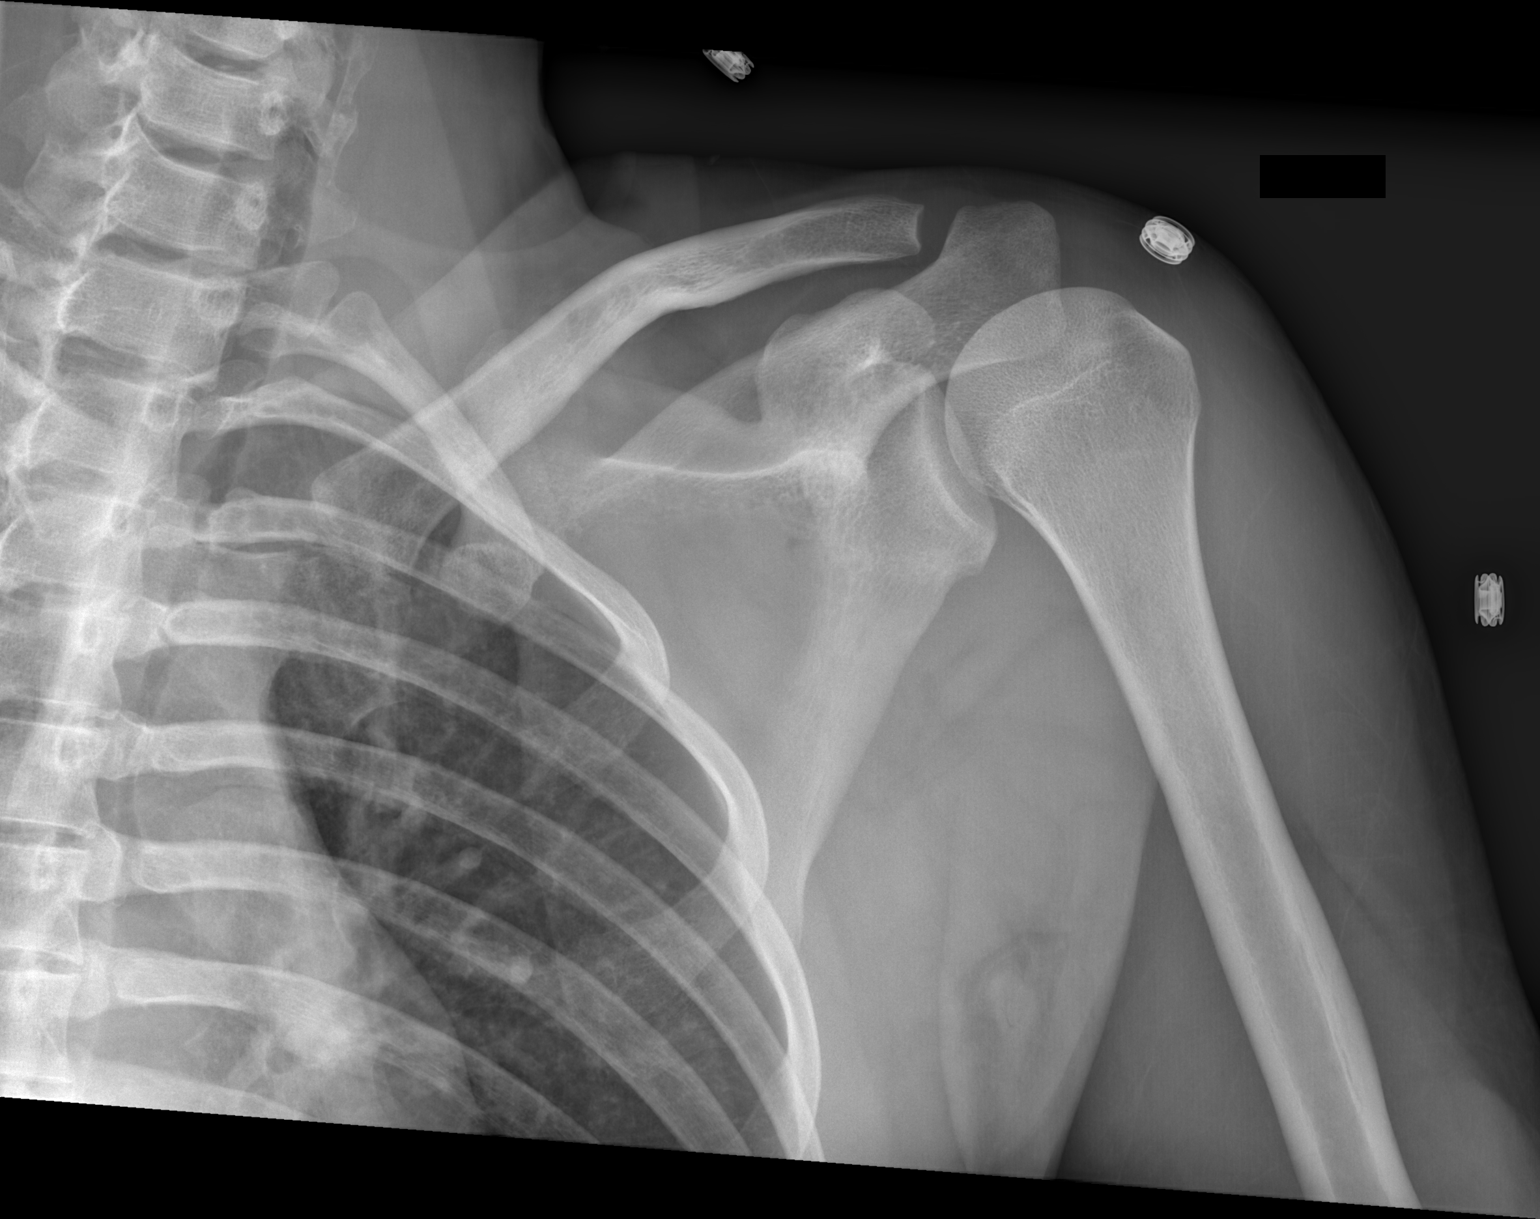

[2 of 2 positions shown; findings below may reference images not displayed]

FINDINGS: There is no evidence of fracture or dislocation. There is no
evidence of arthropathy or other focal bone abnormality. Soft
tissues are unremarkable.
IMPRESSION: No fracture or dislocation of the left shoulder. Joint spaces are
preserved.

## 2020-07-26 ENCOUNTER — Ambulatory Visit: Payer: 59 | Admitting: Medical

## 2020-08-09 ENCOUNTER — Ambulatory Visit: Payer: 59 | Admitting: Medical

## 2020-08-10 ENCOUNTER — Encounter: Payer: Self-pay | Admitting: Medical

## 2021-05-30 ENCOUNTER — Ambulatory Visit
Admission: EM | Admit: 2021-05-30 | Discharge: 2021-05-30 | Disposition: A | Discharge status: Home / Self Care | Payer: 59 | Attending: Family Medicine | Admitting: Family Medicine

## 2021-05-30 ENCOUNTER — Other Ambulatory Visit: Payer: Self-pay

## 2021-05-30 ENCOUNTER — Encounter: Payer: Self-pay | Admitting: Emergency Medicine

## 2021-05-30 DIAGNOSIS — L255 Unspecified contact dermatitis due to plants, except food: Secondary | ICD-10-CM

## 2021-05-30 MED ORDER — SULFAMETHOXAZOLE-TRIMETHOPRIM 800-160 MG PO TABS: 1.0000 | ORAL_TABLET | Freq: Two times a day (BID) | ORAL | 0 refills | Status: AC

## 2021-05-30 MED ORDER — TRIAMCINOLONE ACETONIDE 0.1 % EX CREA: 1.0000 "application " | TOPICAL_CREAM | Freq: Two times a day (BID) | CUTANEOUS | 0 refills | Status: AC

## 2021-05-30 MED ORDER — CEFTRIAXONE SODIUM 500 MG IJ SOLR
500.0000 mg | Freq: Once | INTRAMUSCULAR | Status: AC
  Administered 2021-05-30: 500 mg via INTRAMUSCULAR

## 2021-05-30 MED ORDER — DEXAMETHASONE SODIUM PHOSPHATE 10 MG/ML IJ SOLN
10.0000 mg | Freq: Once | INTRAMUSCULAR | Status: AC
  Administered 2021-05-30: 10 mg via INTRAMUSCULAR

## 2021-05-30 NOTE — ED Triage Notes (Signed)
Poison oak rash since Saturday all over body
